# Patient Record
Sex: Female | Born: 1960 | Race: White | Hispanic: No | Smoking: Never smoker
Health system: Southern US, Community
[De-identification: ages and names within clinical notes are randomized; demographics above are authoritative.]

## PROBLEM LIST (undated history)

## (undated) DIAGNOSIS — I10 Essential (primary) hypertension: Secondary | ICD-10-CM

## (undated) HISTORY — DX: Essential (primary) hypertension: I10

---

## 1997-11-20 ENCOUNTER — Other Ambulatory Visit: Admission: RE | Admit: 1997-11-20 | Discharge: 1997-11-20 | Payer: Self-pay | Admitting: *Deleted

## 1998-03-08 ENCOUNTER — Other Ambulatory Visit: Admission: RE | Admit: 1998-03-08 | Discharge: 1998-03-08 | Payer: Self-pay | Admitting: *Deleted

## 1998-10-28 ENCOUNTER — Other Ambulatory Visit: Admission: RE | Admit: 1998-10-28 | Discharge: 1998-10-28 | Payer: Self-pay | Admitting: *Deleted

## 1999-10-27 ENCOUNTER — Other Ambulatory Visit: Admission: RE | Admit: 1999-10-27 | Discharge: 1999-10-27 | Payer: Self-pay | Admitting: *Deleted

## 2001-02-24 ENCOUNTER — Other Ambulatory Visit: Admission: RE | Admit: 2001-02-24 | Discharge: 2001-02-24 | Payer: Self-pay | Admitting: *Deleted

## 2004-04-21 ENCOUNTER — Emergency Department (HOSPITAL_COMMUNITY): Admission: EM | Admit: 2004-04-21 | Discharge: 2004-04-21 | Payer: Self-pay | Admitting: Emergency Medicine

## 2005-10-15 ENCOUNTER — Ambulatory Visit (HOSPITAL_COMMUNITY): Admission: RE | Admit: 2005-10-15 | Discharge: 2005-10-15 | Payer: Self-pay | Admitting: Obstetrics and Gynecology

## 2006-02-10 ENCOUNTER — Other Ambulatory Visit: Admission: RE | Admit: 2006-02-10 | Discharge: 2006-02-10 | Payer: Self-pay | Admitting: Obstetrics and Gynecology

## 2007-03-28 ENCOUNTER — Other Ambulatory Visit: Admission: RE | Admit: 2007-03-28 | Discharge: 2007-03-28 | Payer: Self-pay | Admitting: Obstetrics and Gynecology

## 2008-04-09 ENCOUNTER — Other Ambulatory Visit: Admission: RE | Admit: 2008-04-09 | Discharge: 2008-04-09 | Payer: Self-pay | Admitting: Obstetrics and Gynecology

## 2009-04-18 ENCOUNTER — Other Ambulatory Visit: Admission: RE | Admit: 2009-04-18 | Discharge: 2009-04-18 | Payer: Self-pay | Admitting: Obstetrics and Gynecology

## 2010-10-17 NOTE — H&P (Signed)
NAME:  Jamie Sampson, Jamie Sampson NO.:  [id number]   MEDICAL RECORD NO.:  [id number]          PATIENT TYPE:  AMB   LOCATION:                                FACILITY:  [facility]   PHYSICIAN:  Gerald Leitz, MD          DATE OF BIRTH:  10/23/1960   DATE OF ADMISSION:  10/15/2005  DATE OF DISCHARGE:                                HISTORY & PHYSICAL   HISTORY OF PRESENT ILLNESS:  This is a 50 year old, gravida 2, para 2-0-0-2,  with a history of menorrhagia. She is currently on Loestrin 24, reports  breakthrough bleeding on the Loestrin. She is a Air traffic controller for [employer] and states that when she has not been no the Loestrin, she has had  periods of extremely heavy bleeding when she would saturate a pad every hour  and soil her pants.  Her blood pressure has been elevated the past two  visits, 146/98 today, 158/98 her last visit.  She has no diagnosis of  chronic hypertension. She is afraid to go off her birth control due to the  severe menorrhagia.  Options were discussed with the patient.  She desires  endometrial ablation.   OB/GYN HISTORY:  Contraception is tubal ligation.  Spontaneous vaginal  delivery x2.  No history of abnormal Pap smears.  Last Pap smear was in July  2006.  The patient states this was normal.   PAST MEDICAL HISTORY:  None.  The patient denies a history of chronic  hypertension.  States that she has white coat syndrome.  This will need to  be further evaluated once she is off her OCPs.   PAST SURGICAL HISTORY:  Tubal ligation in 1990.   MEDICATIONS:  Loestrin 24.   ALLERGIES:  SULFA.   SOCIAL HISTORY:  The patient is a Air traffic controller in [place].  She  denies tobacco, alcohol or illicit drug use.   FAMILY HISTORY:  Paternal aunt with breast cancer. No immediate family  history of breast, ovary or colon cancer.   REVIEW OF SYSTEMS:  Negative except as stated in the history of current  illness.   PHYSICAL EXAMINATION:  VITAL SIGNS:  As stated  in the chart.  CARDIOVASCULAR:  Regular rate and rhythm.  ABDOMEN:  Soft, nontender, nondistended.  Positive bowel sounds.  LUNGS:  Clear.  EXTREMITIES:  No clubbing, cyanosis or edema.  PELVIC:  Normal external female genitalia.  No vulvar, vaginal or cervical  lesions noted.  No adnexal masses or tenderness on bimanual exam.   LABORATORY DATA:  Ultrasound performed on September 14, 2005. She has a uterus  measuring 9.3 cm in length.  AP diameter is 4.8 cm, width is 5.5 cm.  She  had 3 small uterine fibroids less than 2 cm.  Simple cyst on the left ovary,  2.3 x 2.1 cm.  Endometrial thickness was 0.37 mm.   IMPRESSION AND PLAN:  Menorrhagia with elevated blood pressure and  breakthrough bleeding on oral contraceptive pills.  Options of therapy were  discussed with the patient and she desires endometrial  ablation.  Risks,  benefits and alternatives of endometrial ablation were discussed with the  patient including but not limited to infection, bleeding, perforation of the  uterus, damage to surrounding organs, need for further surgery.  Risks of  transfusion were reviewed, although unlikely.  HIV, hepatitis B and C were  discussed with the patient.  She understands all risks and wishes to proceed  with endometrial ablation.      Gerald Leitz, MD  Electronically Signed     TC/MEDQ  D:  09/25/2005  T:  09/25/2005  Job:  [id number]

## 2010-10-17 NOTE — Op Note (Signed)
NAMEIOLA, Jamie Sampson                ACCOUNT NO.:  [id number]   MEDICAL RECORD NO.:  [id number]          PATIENT TYPE:  AMB   LOCATION:  [facility]                           [facility]ACILITY:  [facility]   PHYSICIAN:  Gerald Leitz, MD          DATE OF BIRTH:  December 02, 1960   DATE OF PROCEDURE:  10/15/2005  DATE OF DISCHARGE:                                 OPERATIVE REPORT   PREOPERATIVE DIAGNOSIS:  Menorrhagia.   POSTOPERATIVE DIAGNOSIS:  Menorrhagia.   PROCEDURE:  Endometrial ablation with ThermaChoice.   SURGEON:  Gerald Leitz, M.D.   ASSISTANT:   ANESTHESIA:  General.   SPECIMEN:  None.   ESTIMATED BLOOD LOSS:  Minimal.   FINDINGS:  The uterus sounded to 9 cm.   PROCEDURE:  The patient was taken to the operating room where she was placed  under general anesthesia. She was placed in the dorsal lithotomy position.  She was prepped and draped in the usual sterile fashion.  Bivalve speculum  was placed into the vagina.  The cervix was grasped anteriorly with a single-  tooth tenaculum.  Paracervical block was achieved with a 10 mL of 0.25%  Marcaine without epinephrine. The uterus sounded to approximately 9 cm.  The  ThermaChoice apparatus was primed to negative 150 mm Hg.  Once priming was  complete, it was placed inside the cervical os to a depth of 8 cm. Pressure  was achieved to stabilization of approximately 165 mmHg.  Ablation was  performed at 87 degrees Celsius for 8 minutes.  The apparatus was then  allowed to cool down.  It was removed from the cervix.  The single tenaculum  was removed.  There was noted to be some oozing at the tenaculum site,  silver nitrate was applied.  Excellent hemostasis was noted.  The speculum  was removed from the patient's vagina.  She was awakened from anesthesia.  She was taken to the recovery room awake and in stable condition.  All  instrument, needle, and sponge counts were correct x 2.      Gerald Leitz, MD  Electronically Signed     TC/MEDQ  D:   10/15/2005  T:  10/15/2005  Job:  [id number]

## 2011-02-16 ENCOUNTER — Other Ambulatory Visit: Payer: Self-pay | Admitting: Obstetrics and Gynecology

## 2011-02-16 ENCOUNTER — Other Ambulatory Visit (HOSPITAL_COMMUNITY)
Admission: RE | Admit: 2011-02-16 | Discharge: 2011-02-16 | Disposition: A | Discharge status: Home / Self Care | Payer: BC Managed Care – PPO | Source: Ambulatory Visit | Attending: Obstetrics and Gynecology | Admitting: Obstetrics and Gynecology

## 2011-02-16 DIAGNOSIS — Z01419 Encounter for gynecological examination (general) (routine) without abnormal findings: Secondary | ICD-10-CM | POA: Insufficient documentation

## 2011-04-06 ENCOUNTER — Other Ambulatory Visit: Payer: Self-pay | Admitting: Family Medicine

## 2011-04-06 ENCOUNTER — Ambulatory Visit
Admission: RE | Admit: 2011-04-06 | Discharge: 2011-04-06 | Disposition: A | Discharge status: Home / Self Care | Payer: BC Managed Care – PPO | Source: Ambulatory Visit | Attending: Family Medicine | Admitting: Family Medicine

## 2011-04-06 DIAGNOSIS — T1490XA Injury, unspecified, initial encounter: Secondary | ICD-10-CM

## 2011-04-06 IMAGING — CT CT FOOT*L* W/O CM
1 series · 12 of 14 positions shown, 15 images · non-contrast
Comparison: None

CLINICAL DATA: Fell.  Evaluate foot fracture.

CT OF THE LEFT FOOT WITHOUT CONTRAST
TECHNIQUE: Multidetector CT imaging was performed according to the
standard protocol. Multiplanar CT image reconstructions were also
generated.

[Series 401: batch 1 · axial · 0.55mm/px · z∈[-155,-136]mm · 12 of 37 slices shown, 15 images]
[im 3/37  soft-tissue]
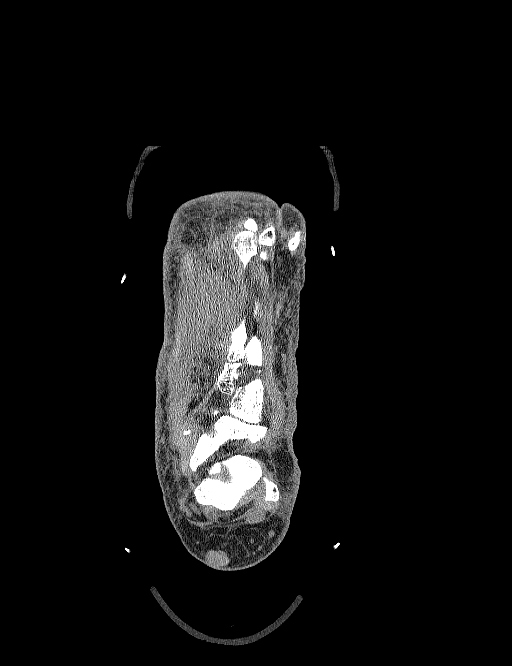
[im 3/37  bone]
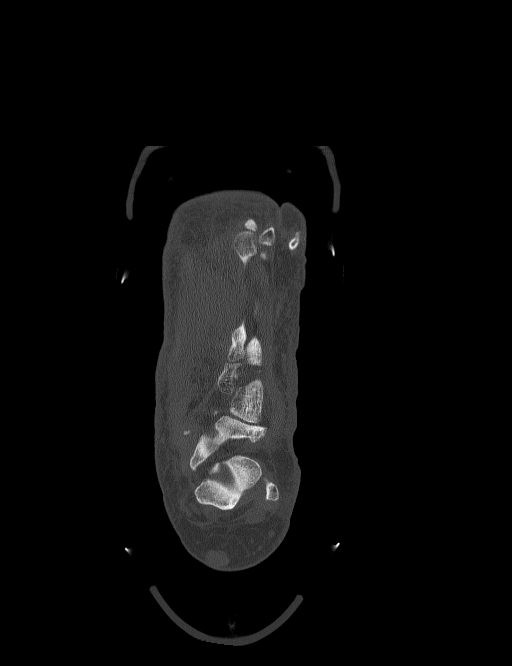
[im 6/37  bone]
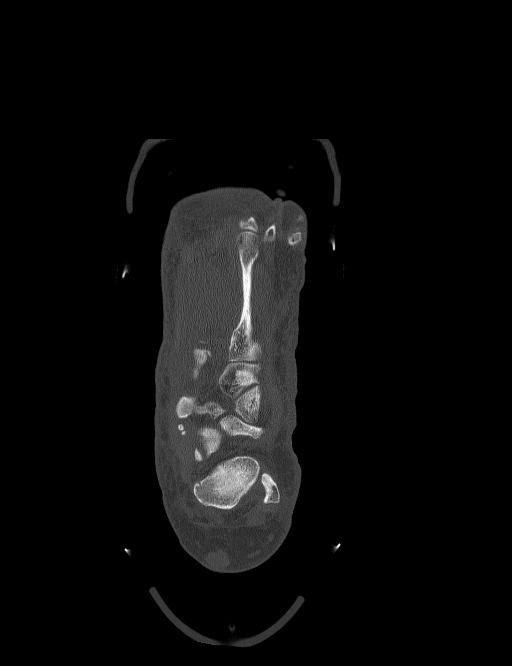
[im 9/37  bone]
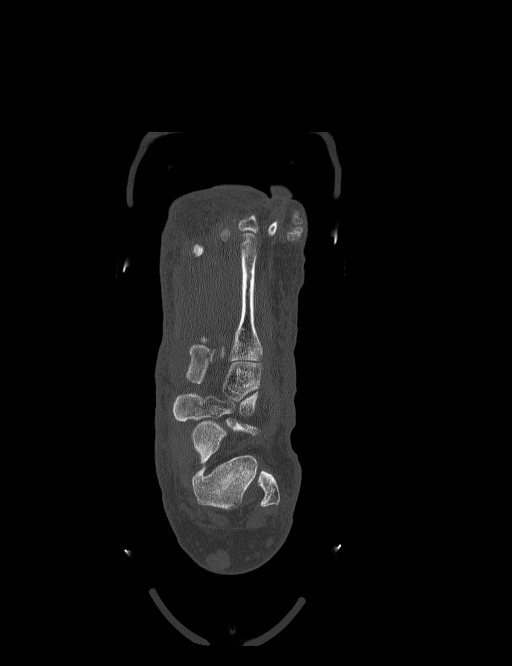
[im 12/37  bone]
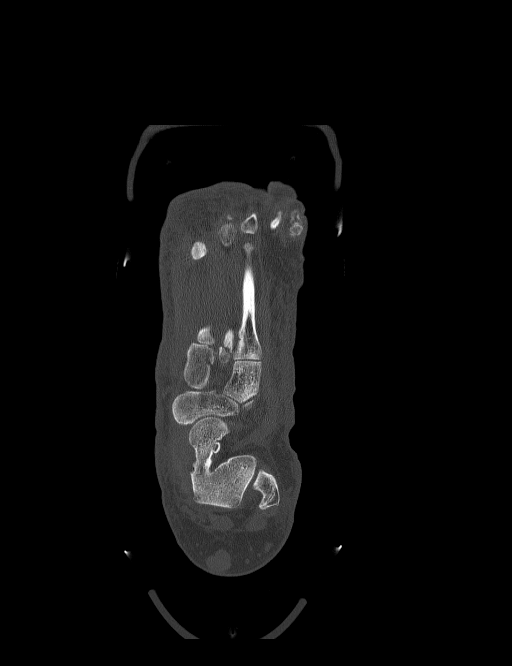
[im 14/37  soft-tissue]
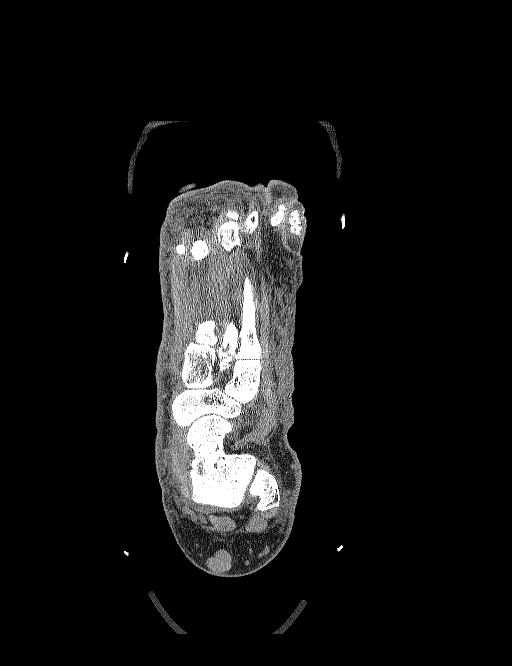
[im 14/37  bone]
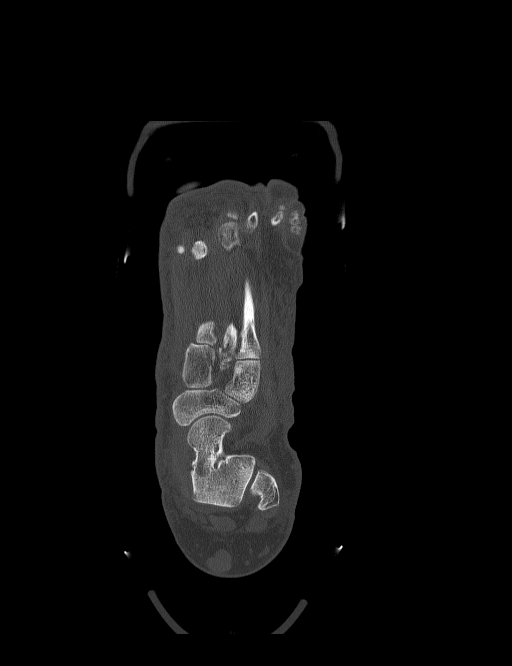
[im 17/37  bone]
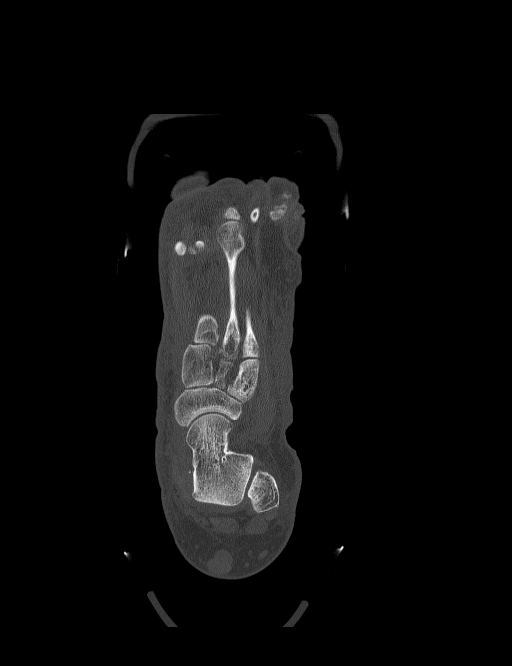
[im 20/37  bone]
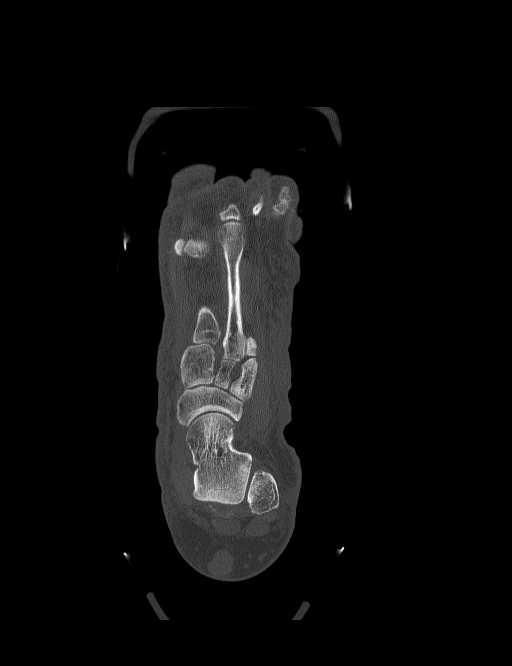
[im 23/37  bone]
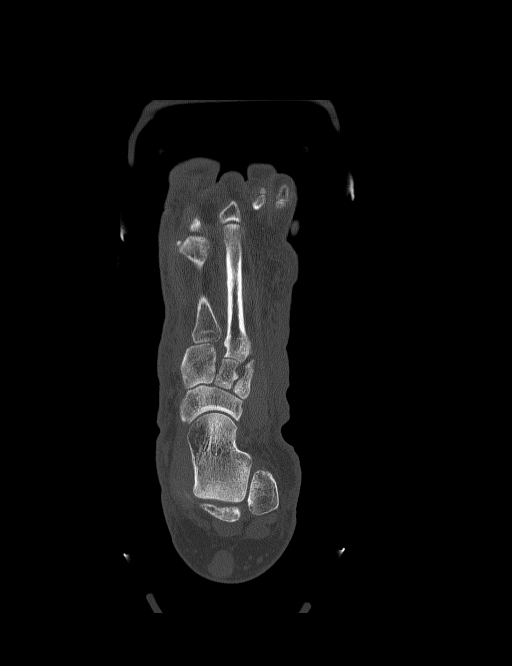
[im 25/37  soft-tissue]
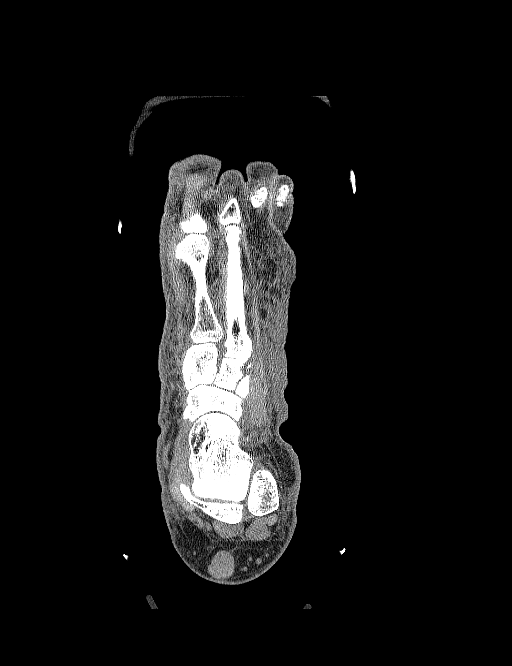
[im 25/37  bone]
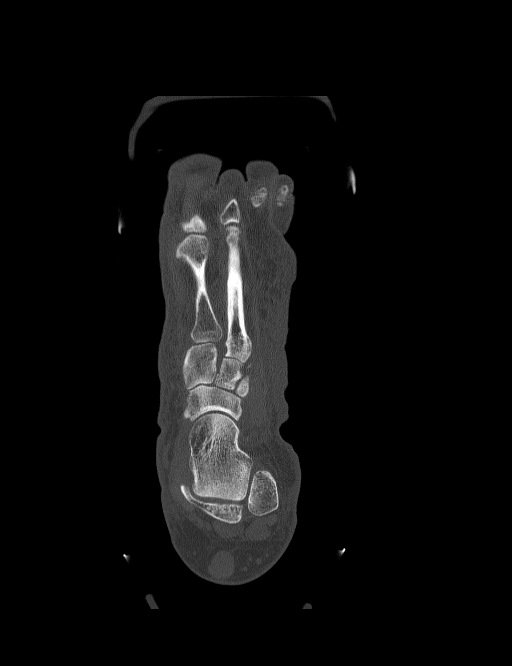
[im 28/37  bone]
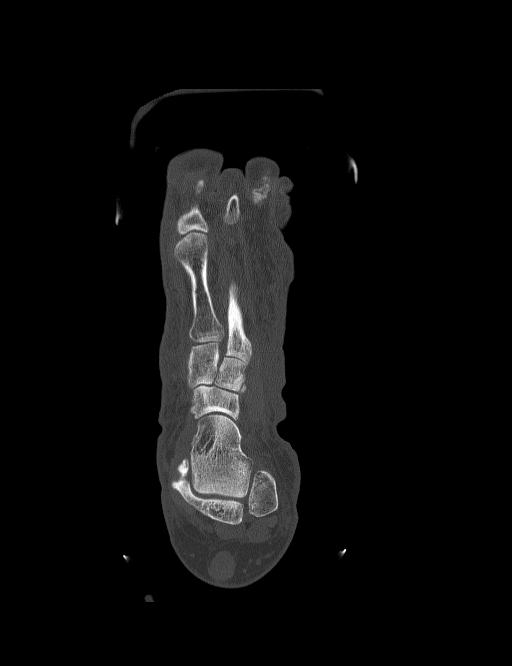
[im 31/37  bone]
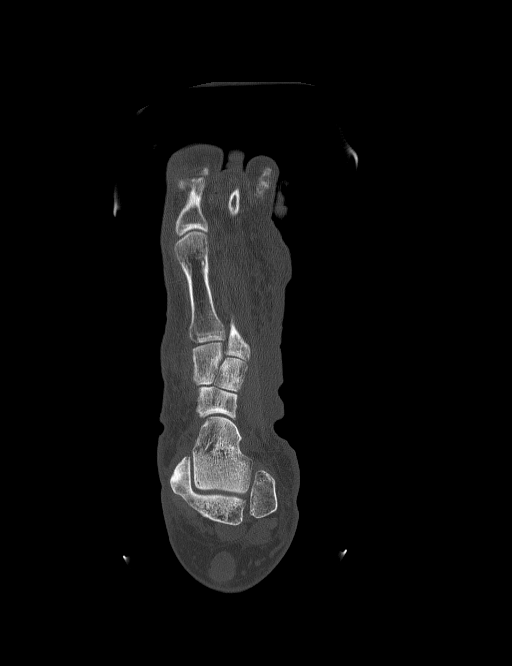
[im 34/37  bone]
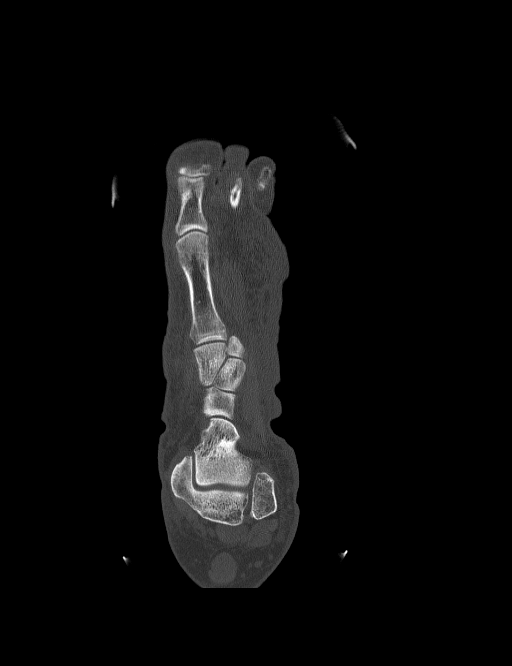

[12 of 14 positions shown; findings below may reference images not displayed]

FINDINGS: There is a fracture through the base of the second
metatarsal along with tiny avulsion fracture involving the base of
the first metatarsal laterally and also a fracture of the lateral
aspect of the medial cuneiform.  There is very slight lateral
subluxation of the first metatarsal in relation to the medial
cuneiform.  Findings consistent with a Lisfranc injury.  The [DATE] and fifth metatarsals are intact.  No fracture of the middle
or lateral cuneiforms.  Cuboid is intact.  The tibiotalar joint is
maintained.  No ankle fracture or osteochondral lesion.  The
subtalar joints are maintained.  The talonavicular joint is normal.
IMPRESSION: 1.  Fractures involving the base of the first and second
metatarsals and a fracture involving the lateral aspect of the
medial cuneiform.
2.  Mild lateral subluxation of the first metatarsal in relation to
the medial cuneiform consistent with a Lisfranc's injury.

## 2012-04-04 ENCOUNTER — Ambulatory Visit (INDEPENDENT_AMBULATORY_CARE_PROVIDER_SITE_OTHER): Payer: BC Managed Care – PPO | Admitting: Physician Assistant

## 2012-04-04 ENCOUNTER — Encounter: Payer: Self-pay | Admitting: Physician Assistant

## 2012-04-04 VITALS — BP 130/86 | HR 98 | Temp 98.5°F | Resp 20 | Ht 64.0 in | Wt 221.8 lb

## 2012-04-04 DIAGNOSIS — G4700 Insomnia, unspecified: Secondary | ICD-10-CM

## 2012-04-04 DIAGNOSIS — I10 Essential (primary) hypertension: Secondary | ICD-10-CM

## 2012-04-04 DIAGNOSIS — R5381 Other malaise: Secondary | ICD-10-CM

## 2012-04-04 LAB — BASIC METABOLIC PANEL
CO2: 25 mEq/L (ref 19–32)
Calcium: 9.8 mg/dL (ref 8.4–10.5)
Chloride: 103 mEq/L (ref 96–112)
Creat: 0.74 mg/dL (ref 0.50–1.10)
Potassium: 4 mEq/L (ref 3.5–5.3)
Sodium: 142 mEq/L (ref 135–145)

## 2012-04-04 LAB — TSH: TSH: 3.118 u[IU]/mL (ref 0.350–4.500)

## 2012-04-04 MED ORDER — LISINOPRIL 5 MG PO TABS: 5.0000 mg | ORAL_TABLET | Freq: Every day | ORAL | Status: DC

## 2012-04-04 NOTE — Progress Notes (Signed)
Patient ID: Jamie Sampson MRN: [id number], DOB: 28-Jul-1960, 51 y.o. Date of Encounter: 04/04/2012, 4:23 PM  Primary Physician: No primary provider on file.  Chief Complaint: HTN  HPI: 51 y.o. year old female with history below presents for hypertension follow up. Doing well. Tolerating current dose of Lisinopril 5 mg daily. Not taking Chlorthalidone. No chest pain, headache, vision changes, or focal deficits. Not exercising or eating healthy. Has continued to gain weight. Would like to lose 50 pounds. States that she usually goes to bed around 10 or 10:30 and can sleep until around midnight, then she wakes up and cannot get back into a good nights sleep for the rest of the night. She is then too tired the following day to get and exercise done, or to cook and healthy meal. She is uncertain is she snores. She does however fall asleep very easily while sitting on the sofa.   She did suffer a Lis-Frank fracture of the left foot almost one year ago, still a little sore, but doing ok.    Past Medical History  Diagnosis Date  . Hypertension      Home Meds: Prior to Admission medications   Medication Sig Start Date End Date Taking? Authorizing Provider  lisinopril (PRINIVIL,ZESTRIL) 5 MG tablet Take 1 tablet (5 mg total) by mouth daily. 04/04/12  Yes Jadin Kagel M Kathrynne Kulinski, PA-C  Multiple Vitamin (MULTIVITAMIN) tablet Take 1 tablet by mouth daily.   Yes Historical Provider, MD    Allergies:  Allergies  Allergen Reactions  . Sulfa Antibiotics Hives    History   Social History  . Marital Status: Divorced    Spouse Name: N/A    Number of Children: N/A  . Years of Education: N/A   Occupational History  . Not on file.   Social History Main Topics  . Smoking status: Never Smoker   . Smokeless tobacco: Not on file  . Alcohol Use: Not on file  . Drug Use: Not on file  . Sexually Active: Not on file   Other Topics Concern  . Not on file   Social History Narrative  . No narrative on file     Family History  Problem Relation Age of Onset  . Hypertension Mother   . Neuropathy Mother   . Cancer Father   . Cancer Paternal Aunt   . Diabetes Maternal Grandmother   . COPD Maternal Grandfather   . Alcohol abuse Maternal Aunt     Review of Systems: Constitutional: negative for chills, fever, or night sweats  HEENT: negative for vision changes, hearing loss, congestion, rhinorrhea, ST, epistaxis, or sinus pressure Cardiovascular: negative for chest pain, palpitations, or DOE Respiratory: negative for hemoptysis, wheezing, shortness of breath, or cough Abdominal: negative for abdominal pain, nausea, vomiting, diarrhea, or constipation Dermatological: negative for rash Neurologic: negative for headache, dizziness, or syncope   Physical Exam: Blood pressure 130/86, pulse 98, temperature 98.5 F (36.9 C), temperature source Oral, resp. rate 20, height 5\' 4"  (1.626 m), weight 221 lb 12.8 oz (100.608 kg), SpO2 98.00%., Body mass index is 38.07 kg/(m^2). General: Well developed, well nourished, in no acute distress. Head: Normocephalic, atraumatic, eyes without discharge, sclera non-icteric, nares are without discharge. Bilateral auditory canals clear, TM's are without perforation, pearly grey and translucent with reflective cone of light bilaterally. Oral cavity moist, posterior pharynx without exudate, erythema, peritonsillar abscess, or post nasal drip.  Neck: Supple. No thyromegaly. Full ROM. No lymphadenopathy. No carotid bruits. Lungs: Clear bilaterally to auscultation without wheezes,  rales, or rhonchi. Breathing is unlabored. Heart: RRR with S1 S2. No murmurs, rubs, or gallops appreciated.  Msk:  Strength and tone normal for age. Extremities/Skin: Warm and dry. No clubbing or cyanosis. No edema. No rashes or suspicious lesions. Distal pulses 2+ and equal bilaterally. Neuro: Alert and oriented X 3. Moves all extremities spontaneously. Gait is normal. CNII-XII grossly in tact.  DTR 2+, cerebellar function intact. Rhomberg normal. Psych:  Responds to questions appropriately with a normal affect.   Labs:  BMP and TSH pending  ASSESSMENT AND PLAN:  51 y.o. year old female with hypertension, insomnia, obesity, and fatigue. 1. Hypertension -Continue Lisinopril 5 mg 1 po daily #90 RF 3 -Healthy diet and exercise -Must lose weight  2. Insomnia/fatigue -Likely sleep apnea component to her fatigue during the day and feelings of not being able to get good sleep -If she were able to get good sleep she would not be as tired during the day and would be able to exercise, she would also feel more energetic about cooking and not just grab something quick and easy because she is so tired.  -Will also check a TSH -Referral for sleep study  3. Obesity -Likely playing a role in the above possible sleep apnea -Must lose weight -Must eat healthy -She agrees to both of these terms -Will recheck patient pending the sleep study   Signed, Eula Listen, PA-C 04/04/2012 4:23 PM

## 2012-11-03 ENCOUNTER — Other Ambulatory Visit: Payer: Self-pay | Admitting: Obstetrics and Gynecology

## 2012-11-03 ENCOUNTER — Other Ambulatory Visit (HOSPITAL_COMMUNITY)
Admission: RE | Admit: 2012-11-03 | Discharge: 2012-11-03 | Disposition: A | Discharge status: Home / Self Care | Payer: BC Managed Care – PPO | Source: Ambulatory Visit | Attending: Obstetrics and Gynecology | Admitting: Obstetrics and Gynecology

## 2012-11-03 DIAGNOSIS — Z1151 Encounter for screening for human papillomavirus (HPV): Secondary | ICD-10-CM | POA: Insufficient documentation

## 2012-11-03 DIAGNOSIS — Z01419 Encounter for gynecological examination (general) (routine) without abnormal findings: Secondary | ICD-10-CM | POA: Insufficient documentation

## 2013-02-08 ENCOUNTER — Encounter: Payer: Self-pay | Admitting: Family Medicine

## 2014-03-26 ENCOUNTER — Other Ambulatory Visit: Payer: Self-pay | Admitting: Obstetrics and Gynecology

## 2014-03-26 ENCOUNTER — Other Ambulatory Visit (HOSPITAL_COMMUNITY)
Admission: RE | Admit: 2014-03-26 | Discharge: 2014-03-26 | Disposition: A | Discharge status: Home / Self Care | Payer: BC Managed Care – PPO | Source: Ambulatory Visit | Attending: Obstetrics and Gynecology | Admitting: Obstetrics and Gynecology

## 2014-03-26 DIAGNOSIS — Z01419 Encounter for gynecological examination (general) (routine) without abnormal findings: Secondary | ICD-10-CM | POA: Insufficient documentation

## 2014-03-27 LAB — CYTOLOGY - PAP

## 2015-04-15 ENCOUNTER — Telehealth: Payer: Self-pay | Admitting: Obstetrics and Gynecology

## 2015-10-14 ENCOUNTER — Other Ambulatory Visit: Payer: Self-pay | Admitting: Obstetrics and Gynecology

## 2015-10-14 ENCOUNTER — Other Ambulatory Visit (HOSPITAL_COMMUNITY)
Admission: RE | Admit: 2015-10-14 | Discharge: 2015-10-14 | Disposition: A | Discharge status: Home / Self Care | Payer: BC Managed Care – PPO | Source: Ambulatory Visit | Attending: Obstetrics and Gynecology | Admitting: Obstetrics and Gynecology

## 2015-10-14 DIAGNOSIS — Z1151 Encounter for screening for human papillomavirus (HPV): Secondary | ICD-10-CM | POA: Diagnosis present

## 2015-10-14 DIAGNOSIS — Z01419 Encounter for gynecological examination (general) (routine) without abnormal findings: Secondary | ICD-10-CM | POA: Diagnosis present

## 2015-10-15 LAB — CYTOLOGY - PAP

## 2016-05-18 NOTE — Telephone Encounter (Signed)
error 

## 2017-10-28 ENCOUNTER — Ambulatory Visit: Payer: BC Managed Care – PPO | Admitting: Allergy and Immunology

## 2017-11-15 ENCOUNTER — Encounter: Payer: Self-pay | Admitting: Podiatry

## 2017-11-15 ENCOUNTER — Ambulatory Visit: Payer: BC Managed Care – PPO | Admitting: Podiatry

## 2017-11-15 ENCOUNTER — Ambulatory Visit (INDEPENDENT_AMBULATORY_CARE_PROVIDER_SITE_OTHER): Payer: BC Managed Care – PPO

## 2017-11-15 VITALS — BP 142/66 | HR 77 | Temp 98.2°F | Resp 16 | Ht 65.0 in | Wt 215.0 lb

## 2017-11-15 DIAGNOSIS — M722 Plantar fascial fibromatosis: Secondary | ICD-10-CM | POA: Diagnosis not present

## 2017-11-15 NOTE — Patient Instructions (Signed)

## 2017-11-15 NOTE — Progress Notes (Signed)
   Subjective:    Patient ID: Jamie Sampson, female    DOB: May 20, 1961, 57 y.o.   MRN: [id number]  HPI    Review of Systems  Musculoskeletal: Positive for arthralgias, gait problem, joint swelling and myalgias.  All other systems reviewed and are negative.      Objective:   Physical Exam        Assessment & Plan:

## 2017-11-30 ENCOUNTER — Telehealth: Payer: Self-pay | Admitting: Podiatry

## 2017-11-30 NOTE — Telephone Encounter (Signed)
I was calling to see if there is anyway I can get a copy of my x-rays to a CD. You can reach me at [phone]. Thank you. Bye bye.

## 2017-11-30 NOTE — Telephone Encounter (Signed)
Called and spoke with patient she will come by and fill out the release and pick up the disc on Wednesday 11/30/17

## 2017-12-01 DIAGNOSIS — M79676 Pain in unspecified toe(s): Secondary | ICD-10-CM

## 2017-12-06 ENCOUNTER — Ambulatory Visit: Payer: BC Managed Care – PPO | Admitting: Podiatry

## 2017-12-29 NOTE — Progress Notes (Signed)
  Subjective:  Patient ID: Jamie Sampson, female    DOB: March 13, 1961,  MRN: [id number]  Chief Complaint  Patient presents with  . Foot Pain    R bottom heel x 3 mo; 3-10/10 sharp constant pain -worst at night Tx: ibuprofne    57 y.o. female presents with the above complaint.  Reports pain to the bottom of the right heel for 3 months.  Reports 3-10 sharp constant pain worse at night.  Review of Systems: Negative except as noted in the HPI. Denies N/V/F/Ch.  Past Medical History:  Diagnosis Date  . Hypertension     Current Outpatient Medications:  .  lisinopril (PRINIVIL,ZESTRIL) 10 MG tablet, Take 10 mg by mouth daily., Disp: , Rfl:  .  lovastatin (MEVACOR) 40 MG tablet, Take 40 mg by mouth at bedtime., Disp: , Rfl:  .  Multiple Vitamin (MULTIVITAMIN) tablet, Take 1 tablet by mouth daily., Disp: , Rfl:   Social History   Tobacco Use  Smoking Status Never Smoker  Smokeless Tobacco Never Used    Allergies  Allergen Reactions  . Sulfa Antibiotics Hives   Objective:   Vitals:   11/15/17 1148  BP: (!) 142/66  Pulse: 77  Resp: 16  Temp: 98.2 F (36.8 C)   Body mass index is 35.78 kg/m. Constitutional Well developed. Well nourished.  Vascular Dorsalis pedis pulses palpable bilaterally. Posterior tibial pulses palpable bilaterally. Capillary refill normal to all digits.  No cyanosis or clubbing noted. Pedal hair growth normal.  Neurologic Normal speech. Oriented to person, place, and time. Epicritic sensation to light touch grossly present bilaterally.  Dermatologic Nails well groomed and normal in appearance. No open wounds. No skin lesions.  Orthopedic: Normal joint ROM without pain or crepitus bilaterally. No visible deformities. Tender to palpation at the calcaneal tuber right. No pain with calcaneal squeeze right. Ankle ROM diminished range of motion right. Silfverskiold Test: positive right.   Radiographs: Taken and reviewed. No acute fractures or  dislocations. No evidence of stress fracture.   Assessment:   1. Plantar fasciitis of right foot    Plan:  Patient was evaluated and treated and all questions answered.  Plantar Fasciitis, right - XR reviewed as above.  - Educated on icing and stretching. Instructions given.  - Injection delivered to the plantar fascia as below. - Pharmacologic management: Meloxicam. Educated on risks/benefits and proper taking of medication.  Procedure: Injection Tendon/Ligament Location: Right plantar fascia at the glabrous junction; medial approach. Skin Prep: alcohol Injectate: 1 cc 0.5% marcaine plain, 1 cc dexamethasone phosphate, 0.5 cc kenalog 10. Disposition: Patient tolerated procedure well. Injection site dressed with a band-aid.  Return in about 3 weeks (around 12/06/2017) for Plantar fasciitis.

## 2018-06-06 ENCOUNTER — Other Ambulatory Visit (HOSPITAL_COMMUNITY)
Admission: RE | Admit: 2018-06-06 | Discharge: 2018-06-06 | Disposition: A | Discharge status: Home / Self Care | Payer: BC Managed Care – PPO | Source: Ambulatory Visit | Attending: Obstetrics and Gynecology | Admitting: Obstetrics and Gynecology

## 2018-06-06 ENCOUNTER — Other Ambulatory Visit: Payer: Self-pay | Admitting: Obstetrics and Gynecology

## 2018-06-06 DIAGNOSIS — Z01419 Encounter for gynecological examination (general) (routine) without abnormal findings: Secondary | ICD-10-CM | POA: Insufficient documentation

## 2018-06-07 LAB — CYTOLOGY - PAP
Diagnosis: NEGATIVE
HPV: NOT DETECTED

## 2021-09-22 ENCOUNTER — Other Ambulatory Visit (HOSPITAL_COMMUNITY)
Admission: RE | Admit: 2021-09-22 | Discharge: 2021-09-22 | Disposition: A | Discharge status: Home / Self Care | Payer: BC Managed Care – PPO | Source: Ambulatory Visit | Attending: Obstetrics and Gynecology | Admitting: Obstetrics and Gynecology

## 2021-09-22 ENCOUNTER — Other Ambulatory Visit: Payer: Self-pay | Admitting: Obstetrics and Gynecology

## 2021-09-22 DIAGNOSIS — Z01419 Encounter for gynecological examination (general) (routine) without abnormal findings: Secondary | ICD-10-CM | POA: Diagnosis not present

## 2021-09-24 LAB — CYTOLOGY - PAP
Comment: NEGATIVE
Diagnosis: NEGATIVE
High risk HPV: NEGATIVE

## 2023-04-28 ENCOUNTER — Encounter (HOSPITAL_BASED_OUTPATIENT_CLINIC_OR_DEPARTMENT_OTHER): Payer: Self-pay

## 2023-04-28 ENCOUNTER — Other Ambulatory Visit (HOSPITAL_BASED_OUTPATIENT_CLINIC_OR_DEPARTMENT_OTHER): Payer: Self-pay

## 2023-04-28 ENCOUNTER — Ambulatory Visit (HOSPITAL_BASED_OUTPATIENT_CLINIC_OR_DEPARTMENT_OTHER)
Admission: EM | Admit: 2023-04-28 | Discharge: 2023-04-28 | Disposition: A | Discharge status: Home / Self Care | Payer: BC Managed Care – PPO

## 2023-04-28 DIAGNOSIS — B350 Tinea barbae and tinea capitis: Secondary | ICD-10-CM | POA: Diagnosis not present

## 2023-04-28 DIAGNOSIS — I10 Essential (primary) hypertension: Secondary | ICD-10-CM

## 2023-04-28 MED ORDER — KETOCONAZOLE 1 % EX SHAM
MEDICATED_SHAMPOO | CUTANEOUS | 0 refills | Status: DC
  Filled 2023-04-28: qty 200, fill #0

## 2023-04-28 MED ORDER — KETOCONAZOLE 2 % EX SHAM
1.0000 | MEDICATED_SHAMPOO | CUTANEOUS | 0 refills | Status: AC
  Filled 2023-04-28: qty 120, 14d supply, fill #0

## 2023-04-28 NOTE — ED Triage Notes (Signed)
Rash to scalp onset Monday. States used topical medications with no improvement. Patient reports recently purchased new memory foam pillows. No drainage. States burning no itching.Patient's blood pressure is elevated. States pcp is changing blood pressure meds.

## 2023-04-28 NOTE — ED Provider Notes (Signed)
[facility]    CSN: [id number] Arrival date & time: 04/28/23  8299      History   Chief Complaint Chief Complaint  Patient presents with   Rash    HPI Jamie Sampson is a 62 y.o. female.   Jamie Sampson is a 62 y.o. female presenting for chief complaint of rash to the right side of the head that started 2 days ago on Monday April 26, 2023. Rash is patchy, red, raised, and slightly itchy per patient. Denies warmth, pain, and drainage to rash. Recently changed pillows to a new memory foam pillow, uses a pillow case. States she sleeps on the right side for most of the night and often gets rather warm in her sleep. Recently changed laundry detergent and was started on Cymbalta a few days ago, otherwise no recent changes in personal hygiene products, medications, shampoos, lotions, perfumes, makeup, etc. Denies history of immunosuppression/diabetes. Used triamcinolone cream to the area once, states this made the rash worse. Also attempted use of leftover flagyl cream, this did not help either. No recent fevers, chills, aches.  BP noted to be elevated at 186/111. History of HTN. Reports recent medication changes by PCP and history of "white coat syndrome". Denies headaches, CP, SOB, palpitations, dizziness, vision changes.     Past Medical History:  Diagnosis Date   Hypertension     There are no problems to display for this patient.   History reviewed. No pertinent surgical history.  OB History   No obstetric history on file.      Home Medications    Prior to Admission medications   Medication Sig Start Date End Date Taking? Authorizing Provider  DULoxetine (CYMBALTA) 30 MG capsule Take 30 mg by mouth daily.   Yes [provider]  ketoconazole (NIZORAL) 2 % shampoo Apply 1 Application topically every 3 (three) days for 14 days. Lather shampoo onto affected scalp, allow to sit for 5-10 minutes without washing off, then rinse thoroughly. Pat dry  after showering. 04/28/23 05/12/23 Yes Carlisle Beers, FNP  rosuvastatin (CRESTOR) 10 MG tablet Take 10 mg by mouth daily.   Yes [provider]  lisinopril (PRINIVIL,ZESTRIL) 10 MG tablet Take 10 mg by mouth daily.    [provider]  lovastatin (MEVACOR) 40 MG tablet Take 40 mg by mouth at bedtime.    [provider]  Multiple Vitamin (MULTIVITAMIN) tablet Take 1 tablet by mouth daily.    [provider]    Family History Family History  Problem Relation Age of Onset   Hypertension Mother    Neuropathy Mother    Cancer Father    Cancer Paternal Aunt    Diabetes Maternal Grandmother    COPD Maternal Grandfather    Alcohol abuse Maternal Aunt     Social History Social History   Tobacco Use   Smoking status: Never   Smokeless tobacco: Never     Allergies   Sulfa antibiotics   Review of Systems Review of Systems Per HPI  Physical Exam Triage Vital Signs ED Triage Vitals  Encounter Vitals Group     BP 04/28/23 0839 (!) 186/111     Systolic BP Percentile --      Diastolic BP Percentile --      Pulse Rate 04/28/23 0839 87     Resp --      Temp 04/28/23 0839 98.4 F (36.9 C)     Temp Source 04/28/23 0839 Oral     SpO2  04/28/23 0839 98 %     Weight --      Height --      Head Circumference --      Peak Flow --      Pain Score 04/28/23 0842 0     Pain Loc --      Pain Education --      Exclude from Growth Chart --    No data found.  Updated Vital Signs BP (!) 186/111 (BP Location: Right Arm)   Pulse 87   Temp 98.4 F (36.9 C) (Oral)   SpO2 98%   Visual Acuity Right Eye Distance:   Left Eye Distance:   Bilateral Distance:    Right Eye Near:   Left Eye Near:    Bilateral Near:     Physical Exam Vitals and nursing note reviewed.  Constitutional:      Appearance: She is not ill-appearing or toxic-appearing.  HENT:     Head: Normocephalic and atraumatic.     Right Ear: Hearing and external ear normal.      Left Ear: Hearing and external ear normal.     Nose: Nose normal.     Mouth/Throat:     Lips: Pink.  Eyes:     General: Lids are normal. Vision grossly intact. Gaze aligned appropriately.     Extraocular Movements: Extraocular movements intact.     Conjunctiva/sclera: Conjunctivae normal.  Cardiovascular:     Rate and Rhythm: Normal rate and regular rhythm.     Heart sounds: Normal heart sounds, S1 normal and S2 normal.  Pulmonary:     Effort: Pulmonary effort is normal. No respiratory distress.     Breath sounds: Normal breath sounds and air entry.  Musculoskeletal:     Cervical back: Neck supple.  Lymphadenopathy:     Cervical: No cervical adenopathy.  Skin:    General: Skin is warm and dry.     Capillary Refill: Capillary refill takes less than 2 seconds.     Findings: Rash present.     Comments: Patchy, raised, hyperpigmented, rash with dispersed papular lesions to the right scalp. No warmth or tenderness to palpation. See image below. Rash is localized to the right temporal/parietal scalp.  Neurological:     General: No focal deficit present.     Mental Status: She is alert and oriented to person, place, and time. Mental status is at baseline.     Cranial Nerves: No dysarthria or facial asymmetry.  Psychiatric:        Mood and Affect: Mood normal.        Speech: Speech normal.        Behavior: Behavior normal.        Thought Content: Thought content normal.        Judgment: Judgment normal.    Right scalp   UC Treatments / Results  Labs (all labs ordered are listed, but only abnormal results are displayed) Labs Reviewed - No data to display  EKG   Radiology No results found.  Procedures Procedures (including critical care time)  Medications Ordered in UC Medications - No data to display  Initial Impression / Assessment and Plan / UC Course  I have reviewed the triage vital signs and the nursing notes.  Pertinent labs & imaging results that were  available during my care of the patient were reviewed by me and considered in my medical decision making (see chart for details).   1. Tinea capitis Unclear etiology of rash, however likely  fungal. Low suspicion for bacterial/inflammatory etiology. Ketoconazole shampoo as directed every 3 days for the next 2 weeks. PCP follow-up in 5-7 days, sooner at urgent care if worsening symptoms. Discussed signs/symptoms of secondary bacterial infection/return precautions.   2. Elevated blood pressure reading in office with diagnosis of hypertension BP elevated today, no red flag signs/symptoms indicating need for referral to ED secondary to elevated BP. Advised PCP follow-up and continue checking BP at home. BP journal encouraged.  Counseled patient on potential for adverse effects with medications prescribed/recommended today, strict ER and return-to-clinic precautions discussed, patient verbalized understanding.    Final Clinical Impressions(s) / UC Diagnoses   Final diagnoses:  Tinea capitis  Essential hypertension     Discharge Instructions      Ketoconazole shampoo every 3 days for the next 2 weeks as instructed on bottle.  Use cotton pillow case.   Follow-up with PCP in 5-7 days, return to urgent care if worsening symptoms such as warmth, swelling, itching, fevers, chills, etc.    ED Prescriptions     Medication Sig Dispense Auth. Provider   KETOCONAZOLE, TOPICAL, 1 % SHAM  (Status: Discontinued) Lather shampoo onto affected scalp, allow to sit for 5-10 minutes without washing off, then rinse thoroughly. Pat dry after showering. 200 mL Reita May M, FNP   ketoconazole (NIZORAL) 2 % shampoo Apply 1 Application topically every 3 (three) days for 14 days. Lather shampoo onto affected scalp, allow to sit for 5-10 minutes without washing off, then rinse thoroughly. Pat dry after showering. 120 mL Carlisle Beers, FNP      PDMP not reviewed this encounter.   Carlisle Beers, [place] 04/28/23 [id number]

## 2023-04-28 NOTE — Discharge Instructions (Signed)
Ketoconazole shampoo every 3 days for the next 2 weeks as instructed on bottle.  Use cotton pillow case.   Follow-up with PCP in 5-7 days, return to urgent care if worsening symptoms such as warmth, swelling, itching, fevers, chills, etc.

## 2023-10-20 ENCOUNTER — Ambulatory Visit (HOSPITAL_BASED_OUTPATIENT_CLINIC_OR_DEPARTMENT_OTHER): Admission: EM | Admit: 2023-10-20 | Discharge: 2023-10-20 | Disposition: A | Discharge status: Home / Self Care

## 2023-10-20 ENCOUNTER — Ambulatory Visit (HOSPITAL_BASED_OUTPATIENT_CLINIC_OR_DEPARTMENT_OTHER): Admitting: Radiology

## 2023-10-20 ENCOUNTER — Encounter (HOSPITAL_BASED_OUTPATIENT_CLINIC_OR_DEPARTMENT_OTHER): Payer: Self-pay

## 2023-10-20 DIAGNOSIS — M7989 Other specified soft tissue disorders: Secondary | ICD-10-CM

## 2023-10-20 DIAGNOSIS — Z23 Encounter for immunization: Secondary | ICD-10-CM

## 2023-10-20 DIAGNOSIS — M79642 Pain in left hand: Secondary | ICD-10-CM | POA: Diagnosis not present

## 2023-10-20 DIAGNOSIS — S61215A Laceration without foreign body of left ring finger without damage to nail, initial encounter: Secondary | ICD-10-CM | POA: Diagnosis not present

## 2023-10-20 DIAGNOSIS — M79641 Pain in right hand: Secondary | ICD-10-CM

## 2023-10-20 MED ORDER — MUPIROCIN 2 % EX OINT: 1.0000 | TOPICAL_OINTMENT | Freq: Two times a day (BID) | CUTANEOUS | 0 refills | Status: DC

## 2023-10-20 MED ORDER — AMOXICILLIN-POT CLAVULANATE 875-125 MG PO TABS: 1.0000 | ORAL_TABLET | Freq: Two times a day (BID) | ORAL | 0 refills | Status: AC

## 2023-10-20 MED ORDER — TETANUS-DIPHTH-ACELL PERTUSSIS 5-2.5-18.5 LF-MCG/0.5 IM SUSY
0.5000 mL | PREFILLED_SYRINGE | Freq: Once | INTRAMUSCULAR | Status: AC
  Administered 2023-10-20: 0.5 mL via INTRAMUSCULAR

## 2023-10-20 NOTE — ED Provider Notes (Signed)
 Jamie Sampson CARE    CSN: [id number] Arrival date & time: 10/20/23  1844      History   Chief Complaint Chief Complaint  Patient presents with   Animal Bite    HPI Jamie Sampson is a 63 y.o. female.   Patient was trying to break up a fight between her dog and 2 other dogs.  Her dog is a pit bull the other dogs were Rottweiler mix.  She has bruising and swelling of both hands and a bite to the palmar surface of the fourth finger of the left hand.  She has good range of motion but her hands hurt.   Animal Bite Associated symptoms: no fever and no rash     Past Medical History:  Diagnosis Date   Hypertension     There are no active problems to display for this patient.   History reviewed. No pertinent surgical history.  OB History   No obstetric history on file.      Home Medications    Prior to Admission medications   Medication Sig Start Date End Date Taking? Authorizing Provider  amoxicillin-clavulanate (AUGMENTIN) 875-125 MG tablet Take 1 tablet by mouth 2 (two) times daily after a meal for 7 days. 10/20/23 10/27/23 Yes Guss Legacy, FNP  hydrochlorothiazide (MICROZIDE) 12.5 MG capsule Take 12.5 mg by mouth daily. 09/27/23  Yes [provider]  losartan (COZAAR) 50 MG tablet Take 50 mg by mouth daily. 08/16/23  Yes [provider]  mupirocin ointment (BACTROBAN) 2 % Apply 1 Application topically 2 (two) times daily. 10/20/23  Yes Guss Legacy, FNP  Multiple Vitamin (MULTIVITAMIN) tablet Take 1 tablet by mouth daily.    [provider]  rosuvastatin (CRESTOR) 10 MG tablet Take 10 mg by mouth daily.    [provider]    Family History Family History  Problem Relation Age of Onset   Hypertension Mother    Neuropathy Mother    Cancer Father    Cancer Paternal Aunt    Diabetes Maternal Grandmother    COPD Maternal Grandfather    Alcohol abuse Maternal Aunt     Social History Social History   Tobacco Use   Smoking  status: Never   Smokeless tobacco: Never     Allergies   Sulfa antibiotics   Review of Systems Review of Systems  Constitutional:  Negative for fever.  Respiratory:  Negative for cough.   Cardiovascular:  Negative for chest pain.  Gastrointestinal:  Negative for abdominal pain, constipation, diarrhea, nausea and vomiting.  Musculoskeletal:  Positive for joint swelling (Both hands with swelling on the dorsum of the hands and some bruising.). Negative for arthralgias and back pain.  Skin:  Positive for wound (1 cm wound of left fourth finger on the dorsum of the finger.). Negative for color change and rash.  Neurological:  Negative for syncope.  All other systems reviewed and are negative.    Physical Exam Triage Vital Signs ED Triage Vitals [10/20/23 1939]  Encounter Vitals Group     BP (!) 191/105     Systolic BP Percentile      Diastolic BP Percentile      Pulse Rate 89     Resp 20     Temp 98.6 F (37 C)     Temp Source Oral     SpO2 98 %     Weight      Height      Head Circumference      Peak Flow  Pain Score 5     Pain Loc      Pain Education      Exclude from Growth Chart    No data found.  Updated Vital Signs BP (!) 191/105 (BP Location: Right Arm)   Pulse 89   Temp 98.6 F (37 C) (Oral)   Resp 20   SpO2 98%   Visual Acuity Right Eye Distance:   Left Eye Distance:   Bilateral Distance:    Right Eye Near:   Left Eye Near:    Bilateral Near:     Physical Exam Vitals and nursing note reviewed.  Constitutional:      General: She is not in acute distress.    Appearance: She is well-developed. She is not ill-appearing or toxic-appearing.  HENT:     Head: Normocephalic and atraumatic.     Right Ear: External ear normal.     Left Ear: External ear normal.     Nose: Nose normal.     Mouth/Throat:     Lips: Pink.     Mouth: Mucous membranes are moist.  Eyes:     Conjunctiva/sclera: Conjunctivae normal.     Pupils: Pupils are equal, round,  and reactive to light.  Cardiovascular:     Rate and Rhythm: Normal rate and regular rhythm.     Heart sounds: S1 normal and S2 normal. No murmur heard. Pulmonary:     Effort: Pulmonary effort is normal. No respiratory distress.     Breath sounds: Normal breath sounds. No decreased breath sounds, wheezing, rhonchi or rales.  Musculoskeletal:        General: No swelling.     Right shoulder: Normal.     Left shoulder: Normal.     Right upper arm: Normal.     Left upper arm: Normal.     Right elbow: Normal.     Left elbow: Normal.     Right forearm: Normal.     Left forearm: Normal.     Right wrist: Normal.     Left wrist: Normal.     Right hand: Swelling and tenderness present. Normal range of motion.     Left hand: Swelling and tenderness present. Normal range of motion.     Comments: Full range of motion of both hands but she has swelling with ecchymosis on the dorsum of both hands.  Skin:    General: Skin is warm and dry.     Capillary Refill: Capillary refill takes less than 2 seconds.     Findings: Laceration (See comments for full description.) present. No rash.     Comments: Left fourth finger with a puncture wound from a dog bite that is approximately 1 cm in send slightly curved pattern curves upwards towards the tip of the finger bilaterally.  There is fatty tissue coming out of the wound.  This was a new injury tonight.  Neurological:     Mental Status: She is alert and oriented to person, place, and time.  Psychiatric:        Mood and Affect: Mood normal.      UC Treatments / Results  Labs (all labs ordered are listed, but only abnormal results are displayed) Labs Reviewed - No data to display  EKG   Radiology DG Hand Complete Left Result Date: 10/20/2023 CLINICAL DATA:  Pain and swelling EXAM: LEFT HAND - COMPLETE 3+ VIEW COMPARISON:  None Available. FINDINGS: Possible single-view tuft fracture fourth distal phalanx. No radiopaque foreign body in the soft  tissues. No malalignment IMPRESSION: Possible single-view tuft fracture fourth distal phalanx. Electronically Signed   By: Esmeralda Hedge M.D.   On: 10/20/2023 20:43   DG Hand Complete Right Result Date: 10/20/2023 CLINICAL DATA:  Pain and swelling EXAM: RIGHT HAND - COMPLETE 3+ VIEW COMPARISON:  None Available. FINDINGS: No fracture or malalignment. No radiopaque foreign body in the soft tissues. Mild degenerative change at the first [facility] joint IMPRESSION: No acute osseous abnormality. Electronically Signed   By: Esmeralda Hedge M.D.   On: 10/20/2023 20:42    Procedures Laceration Repair  Date/Time: 10/20/2023 8:50 PM  Performed by: Guss Legacy, FNP Authorized by: Guss Legacy, FNP   Consent:    Consent obtained:  Verbal   Consent given by:  Patient   Risks, benefits, and alternatives were discussed: yes     Risks discussed:  Infection, need for additional repair, nerve damage, poor wound healing, poor cosmetic result, pain, retained foreign body, tendon damage and vascular damage   Alternatives discussed:  No treatment Universal protocol:    Procedure explained and questions answered to patient or proxy's satisfaction: yes     Immediately prior to procedure, a time out was called: yes     Patient identity confirmed:  Verbally with patient Anesthesia:    Anesthesia method:  Local infiltration   Local anesthetic:  Lidocaine 1% w/o epi Laceration details:    Location:  Finger   Finger location:  L ring finger   Length (cm):  1 (Crescent moon shape with the edges upwards in the semicircle downwards like a smile.)   Depth (mm):  2 Pre-procedure details:    Preparation:  Patient was prepped and draped in usual sterile fashion Exploration:    Limited defect created (wound extended): no     Hemostasis achieved with:  Direct pressure   Wound exploration: wound explored through full range of motion     Contaminated: yes   Treatment:    Area cleansed with:  Chlorhexidine   Amount of  cleaning:  Extensive   Irrigation solution:  Sterile saline   Irrigation volume:  20 ml   Irrigation method:  Syringe   Visualized foreign bodies/material removed: no     Debridement:  Minimal (Had to cut away some excess fatty tissue that was pushing out of the wound.)   Undermining:  None   Scar revision: no   Skin repair:    Repair method:  Sutures   Suture size:  4-0   Suture material:  Prolene   Suture technique:  Simple interrupted   Number of sutures:  3 Approximation:    Approximation:  Close Repair type:    Repair type:  Simple Post-procedure details:    Dressing:  Antibiotic ointment and non-adherent dressing   Procedure completion:  Tolerated well, no immediate complications  (including critical care time)  Medications Ordered in UC Medications  Tdap (BOOSTRIX) injection 0.5 mL (0.5 mLs Intramuscular Given 10/20/23 2026)    Initial Impression / Assessment and Plan / UC Course  I have reviewed the triage vital signs and the nursing notes.  Pertinent labs & imaging results that were available during my care of the patient were reviewed by me and considered in my medical decision making (see chart for details).  Plan of Care: Laceration to left fourth finger: Finger was cleaned well.  Had to cut away some excess fatty tissue.  Laceration was closed with 3 sutures.  Clean the site with warm soapy water, rinse, pat dry, apply  antibiotic ointment and a dry dressing.  Return in 7 to 10 days for suture removal.  Tetanus shot given today.  Given that it is a dog bite, Augmentin 875-125, 1 pill twice daily for 7 days.  Follow-up if symptoms do not improve, worsen or new symptoms occur.  I reviewed the plan of care with the patient and/or the patient's guardian.  The patient and/or guardian had time to ask questions and acknowledged that the questions were answered.  I provided instruction on symptoms or reasons to return here or to go to an ER, if symptoms/condition did not  improve, worsened or if new symptoms occurred.  Final Clinical Impressions(s) / UC Diagnoses   Final diagnoses:  Bilateral hand pain  Bilateral hand swelling  Laceration of left ring finger without foreign body without damage to nail, initial encounter     Discharge Instructions      X-rays were negative.  Clean finger with warm soapy water, pat dry, apply mupirocin ointment and a dry dressing.  Clean the finger once daily.  Augmentin, 875/125 mg, 1 pill twice daily for 7 days.  Get plenty of fluids and rest.  Elevate hands is much as possible and that will reduce swelling.  Use over-the-counter acetaminophen or ibuprofen for pain as needed tetanus booster given today.  Follow-up if symptoms do not improve, worsen or new symptoms occur.  Return in 7 to 10 days for suture removal.   ED Prescriptions     Medication Sig Dispense Auth. Provider   amoxicillin-clavulanate (AUGMENTIN) 875-125 MG tablet Take 1 tablet by mouth 2 (two) times daily after a meal for 7 days. 14 tablet Moxie Kalil, FNP   mupirocin ointment (BACTROBAN) 2 % Apply 1 Application topically 2 (two) times daily. 22 g Guss Legacy, FNP      PDMP not reviewed this encounter.   Guss Legacy, FNP 10/20/23 2100

## 2023-10-20 NOTE — ED Triage Notes (Signed)
 Patient attempted to break up fight between her own pit bull and rotweiller mix dogs. Bite to pad of 4th digit. Bruising to knuckles on right hand. Good ROM. Bruising to back of right forearm.

## 2023-10-20 NOTE — Discharge Instructions (Addendum)
 X-rays were negative.  Clean finger with warm soapy water, pat dry, apply mupirocin ointment and a dry dressing.  Clean the finger once daily.  Augmentin, 875/125 mg, 1 pill twice daily for 7 days.  Get plenty of fluids and rest.  Elevate hands is much as possible and that will reduce swelling.  Use over-the-counter acetaminophen or ibuprofen for pain as needed tetanus booster given today.  Follow-up if symptoms do not improve, worsen or new symptoms occur.  Return in 7 to 10 days for suture removal.

## 2023-10-29 ENCOUNTER — Encounter (HOSPITAL_BASED_OUTPATIENT_CLINIC_OR_DEPARTMENT_OTHER): Payer: Self-pay

## 2023-10-29 ENCOUNTER — Ambulatory Visit (HOSPITAL_BASED_OUTPATIENT_CLINIC_OR_DEPARTMENT_OTHER)
Admission: RE | Admit: 2023-10-29 | Discharge: 2023-10-29 | Disposition: A | Discharge status: Home / Self Care | Source: Ambulatory Visit | Attending: Family Medicine | Admitting: Family Medicine

## 2023-10-29 VITALS — BP 147/90 | HR 88 | Temp 99.4°F | Resp 18

## 2023-10-29 DIAGNOSIS — S65515D Laceration of blood vessel of left ring finger, subsequent encounter: Secondary | ICD-10-CM

## 2023-10-29 NOTE — Discharge Instructions (Addendum)
 All sutures removed without any difficulty.  Wound is well-approximated without erythema, edema nor exudate.  Patient tolerated the procedure well.  Continue to clean the wound with warm soapy water, rinse, pat dry and apply mupirocin ointment as needed.  Follow-up as needed.

## 2023-10-29 NOTE — ED Provider Notes (Signed)
 [name] [facility]    CSN: [id number] Arrival date & time: 10/29/23  0812      History   Chief Complaint Chief Complaint  Patient presents with   Suture / Staple Removal    Entered by patient    HPI Ara Grandmaison is a 63 y.o. female.   Here for nurse visit for suture removal only   Suture / Staple Removal    Past Medical History:  Diagnosis Date   Hypertension     There are no active problems to display for this patient.   History reviewed. No pertinent surgical history.  OB History   No obstetric history on file.      Home Medications    Prior to Admission medications   Medication Sig Start Date End Date Taking? Authorizing Provider  hydrochlorothiazide (MICROZIDE) 12.5 MG capsule Take 12.5 mg by mouth daily. 09/27/23   [provider]  losartan (COZAAR) 50 MG tablet Take 50 mg by mouth daily. 08/16/23   [provider]  Multiple Vitamin (MULTIVITAMIN) tablet Take 1 tablet by mouth daily.    [provider]  mupirocin  ointment (BACTROBAN ) 2 % Apply 1 Application topically 2 (two) times daily. 10/20/23   Guss Legacy, FNP  rosuvastatin (CRESTOR) 10 MG tablet Take 10 mg by mouth daily.    [provider]    Family History Family History  Problem Relation Age of Onset   Hypertension Mother    Neuropathy Mother    Cancer Father    Cancer Paternal Aunt    Diabetes Maternal Grandmother    COPD Maternal Grandfather    Alcohol abuse Maternal Aunt     Social History Social History   Tobacco Use   Smoking status: Never   Smokeless tobacco: Never     Allergies   Lovastatin, Venlafaxine, Lisinopril , and Sulfa antibiotics   Review of Systems Review of Systems   Physical Exam Triage Vital Signs ED Triage Vitals  Encounter Vitals Group     BP 10/29/23 0824 (!) 147/90     Systolic BP Percentile --      Diastolic BP Percentile --      Pulse Rate 10/29/23 0824 88     Resp 10/29/23 0824 18     Temp  10/29/23 0824 99.4 F (37.4 C)     Temp Source 10/29/23 0824 Oral     SpO2 10/29/23 0824 95 %     Weight --      Height --      Head Circumference --      Peak Flow --      Pain Score 10/29/23 0823 0     Pain Loc --      Pain Education --      Exclude from Growth Chart --    No data found.  Updated Vital Signs BP (!) 147/90 (BP Location: Right Arm)   Pulse 88   Temp 99.4 F (37.4 C) (Oral)   Resp 18   SpO2 95%   Visual Acuity Right Eye Distance:   Left Eye Distance:   Bilateral Distance:    Right Eye Near:   Left Eye Near:    Bilateral Near:     Physical Exam   UC Treatments / Results  Labs (all labs ordered are listed, but only abnormal results are displayed) Labs Reviewed - No data to display  EKG   Radiology No results found.  Procedures Procedures (including critical care time)  Medications Ordered in UC Medications -  No data to display  Initial Impression / Assessment and Plan / UC Course  I have reviewed the triage vital signs and the nursing notes.  Pertinent labs & imaging results that were available during my care of the patient were reviewed by me and considered in my medical decision making (see chart for details).  Plan of Care: Laceration of left ring finger: Here for suture removal.  Wound looks good.  Sutures removed without difficulty.  Wound care discussed.  Follow-up as needed.  I reviewed the plan of care with the patient and/or the patient's guardian.  The patient and/or guardian had time to ask questions and acknowledged that the questions were answered.  I provided instruction on symptoms or reasons to return here or to go to an ER, if symptoms/condition did not improve, worsened or if new symptoms occurred.  Final Clinical Impressions(s) / UC Diagnoses   Final diagnoses:  Laceration of blood vessel of left ring finger, subsequent encounter     Discharge Instructions      All sutures removed without any difficulty.  Wound is  well-approximated without erythema, edema nor exudate.  Patient tolerated the procedure well.  Continue to clean the wound with warm soapy water, rinse, pat dry and apply mupirocin ointment as needed.  Follow-up as needed.   ED Prescriptions   None    PDMP not reviewed this encounter.   Guss Legacy, FNP 10/29/23 [phone]

## 2023-10-29 NOTE — ED Triage Notes (Signed)
 Pt here to get out 3 sutures in left 4th finger. Denies any s/s of infection. Completed round of antibiotics that was prescribed.

## 2023-11-03 ENCOUNTER — Ambulatory Visit (HOSPITAL_BASED_OUTPATIENT_CLINIC_OR_DEPARTMENT_OTHER)
Admission: RE | Admit: 2023-11-03 | Discharge: 2023-11-03 | Disposition: A | Discharge status: Home / Self Care | Source: Ambulatory Visit | Attending: Family Medicine | Admitting: Family Medicine

## 2023-11-03 ENCOUNTER — Encounter (HOSPITAL_BASED_OUTPATIENT_CLINIC_OR_DEPARTMENT_OTHER): Payer: Self-pay

## 2023-11-03 VITALS — BP 165/89 | HR 90 | Temp 99.1°F | Resp 18

## 2023-11-03 DIAGNOSIS — S61215D Laceration without foreign body of left ring finger without damage to nail, subsequent encounter: Secondary | ICD-10-CM

## 2023-11-03 MED ORDER — MUPIROCIN 2 % EX OINT: 1.0000 | TOPICAL_OINTMENT | Freq: Two times a day (BID) | CUTANEOUS | 1 refills | Status: AC

## 2023-11-03 NOTE — ED Provider Notes (Signed)
 Jamie Sampson [facility]ARE    CSN: [id number] Arrival date & time: 11/03/23  0845      History   Chief Complaint Chief Complaint  Patient presents with   Finger Injury    Recheck cut on finger - Entered by patient    HPI Jamie Sampson is a 63 y.o. female.   On 10/20/23, the patient was trying to break up a fight between her dog and 2 other dogs.  Her dog is a pit bull the other dogs were Rottweiler mix.  She has bruising and swelling of both hands and a bite to the palmar surface of the fourth finger of the left hand. She was seen on 10/20/23, had the laceration sutured and was treated with Mupirocin  ointment topically and Augmentin  875/125 mg twice daily x 7 days.  Her sutures were removed on 10/29/23.  There is some serous or watery drainage.  She just wants to make sure area is healing right and would also like a refill on mupirocin  ointment.        Past Medical History:  Diagnosis Date   Hypertension     There are no active problems to display for this patient.   History reviewed. No pertinent surgical history.  OB History   No obstetric history on file.      Home Medications    Prior to Admission medications   Medication Sig Start Date End Date Taking? Authorizing Provider  hydrochlorothiazide (MICROZIDE) 12.5 MG capsule Take 12.5 mg by mouth daily. 09/27/23   [provider]  losartan (COZAAR) 50 MG tablet Take 50 mg by mouth daily. 08/16/23   [provider]  Multiple Vitamin (MULTIVITAMIN) tablet Take 1 tablet by mouth daily.    [provider]  mupirocin  ointment (BACTROBAN ) 2 % Apply 1 Application topically 2 (two) times daily. 11/03/23   Guss Legacy, FNP  rosuvastatin (CRESTOR) 10 MG tablet Take 10 mg by mouth daily.    [provider]    Family History Family History  Problem Relation Age of Onset   Hypertension Mother    Neuropathy Mother    Cancer Father    Cancer Paternal Aunt    Diabetes Maternal Grandmother     COPD Maternal Grandfather    Alcohol abuse Maternal Aunt     Social History Social History   Tobacco Use   Smoking status: Never   Smokeless tobacco: Never     Allergies   Lovastatin, Venlafaxine, Lisinopril , and Sulfa antibiotics   Review of Systems Review of Systems  Skin:  Positive for wound (some drainage from her finger after suture removal on 10/29/23.).     Physical Exam Triage Vital Signs ED Triage Vitals  Encounter Vitals Group     BP 11/03/23 0854 (!) 165/89     Systolic BP Percentile --      Diastolic BP Percentile --      Pulse Rate 11/03/23 0854 90     Resp 11/03/23 0854 18     Temp 11/03/23 0854 99.1 F (37.3 C)     Temp Source 11/03/23 0854 Oral     SpO2 11/03/23 0854 98 %     Weight --      Height --      Head Circumference --      Peak Flow --      Pain Score 11/03/23 0853 0     Pain Loc --      Pain Education --      Exclude from  Growth Chart --    No data found.  Updated Vital Signs BP (!) 165/89 (BP Location: Right Arm)   Pulse 90   Temp 99.1 F (37.3 C) (Oral)   Resp 18   SpO2 98%   Visual Acuity Right Eye Distance:   Left Eye Distance:   Bilateral Distance:    Right Eye Near:   Left Eye Near:    Bilateral Near:     Physical Exam Vitals and nursing note reviewed.  Constitutional:      General: She is not in acute distress.    Appearance: She is well-developed. She is not ill-appearing or toxic-appearing.  HENT:     Head: Normocephalic and atraumatic.     Right Ear: External ear normal.     Left Ear: External ear normal.     Nose: Nose normal.     Mouth/Throat:     Lips: Pink.     Mouth: Mucous membranes are moist.  Eyes:     Conjunctiva/sclera: Conjunctivae normal.     Pupils: Pupils are equal, round, and reactive to light.  Cardiovascular:     Rate and Rhythm: Normal rate and regular rhythm.     Heart sounds: S1 normal and S2 normal. No murmur heard. Pulmonary:     Effort: Pulmonary effort is normal. No  respiratory distress.     Breath sounds: Normal breath sounds. No decreased breath sounds, wheezing, rhonchi or rales.  Musculoskeletal:        General: No swelling.  Skin:    General: Skin is warm and dry.     Capillary Refill: Capillary refill takes less than 2 seconds.     Findings: No rash.  Neurological:     Mental Status: She is alert and oriented to person, place, and time.  Psychiatric:        Mood and Affect: Mood normal.      UC Treatments / Results  Labs (all labs ordered are listed, but only abnormal results are displayed) Labs Reviewed - No data to display  EKG   Radiology No results found.  Procedures Procedures (including critical care time)  Medications Ordered in UC Medications - No data to display  Initial Impression / Assessment and Plan / UC Course  I have reviewed the triage vital signs and the nursing notes.  Pertinent labs & imaging results that were available during my care of the patient were reviewed by me and considered in my medical decision making (see chart for details).  Plan of Care: Laceration of left fourth ring finger: Patient had some serous drainage this morning but there is no sign of purulent discharge nor erythema nor induration.  The laceration is approximated with a small less than 1 mm gap and early granulation tissue.  Advised about signs and symptoms of infection and reasons to return for follow-up.  I would start a new antibiotic if there was any concern for infection but right now I think she just needs to allow it to start to dry and granulate.  I refilled her mupirocin  for use as needed.  Advised that when she is at home she can leave it open to air.  [place] she should have it covered with a bandage and wear gloves.  I reviewed the plan of care with the patient and/or the patient's guardian.  The patient and/or guardian had time to ask questions and acknowledged that the questions were answered.  I provided  instruction on symptoms  or reasons to return here or to go to an ER, if symptoms/condition did not improve, worsened or if new symptoms occurred.  Final Clinical Impressions(s) / UC Diagnoses   Final diagnoses:  Laceration of left ring finger without foreign body without damage to nail, subsequent encounter     Discharge Instructions      Clean the finger with warm soapy fingers, rinse, pat dry, apply mupirocin  ointment and at this point can leave open to air.  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  Return if any redness, pus or thick drainage or if not closing and healing appropriately.   ED Prescriptions     Medication Sig Dispense Auth. Provider   mupirocin  ointment (BACTROBAN ) 2 % Apply 1 Application topically 2 (two) times daily. 22 g Guss Legacy, FNP      PDMP not reviewed this encounter.   Guss Legacy, FNP 11/03/23 [phone]

## 2023-11-03 NOTE — Discharge Instructions (Addendum)
 Clean the finger with warm soapy fingers, rinse, pat dry, apply mupirocin  ointment and at this point can leave open to air.  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  Return if any redness, pus or thick drainage or if not closing and healing appropriately.

## 2023-11-03 NOTE — ED Triage Notes (Addendum)
 Pt reports she had stitches out Friday and scab came off yesterday and some drainage noted. She just wants to make sure area is healing right and would also like a refill on mupirocin  ointment.

## 2024-04-29 ENCOUNTER — Encounter (HOSPITAL_BASED_OUTPATIENT_CLINIC_OR_DEPARTMENT_OTHER): Payer: Self-pay

## 2024-04-29 ENCOUNTER — Ambulatory Visit (HOSPITAL_BASED_OUTPATIENT_CLINIC_OR_DEPARTMENT_OTHER)
Admission: RE | Admit: 2024-04-29 | Discharge: 2024-04-29 | Disposition: A | Discharge status: Home / Self Care | Source: Ambulatory Visit | Attending: Family Medicine | Admitting: Family Medicine

## 2024-04-29 VITALS — BP 158/94 | HR 72 | Temp 98.4°F | Resp 18

## 2024-04-29 DIAGNOSIS — R220 Localized swelling, mass and lump, head: Secondary | ICD-10-CM | POA: Diagnosis not present

## 2024-04-29 DIAGNOSIS — K029 Dental caries, unspecified: Secondary | ICD-10-CM

## 2024-04-29 DIAGNOSIS — H0014 Chalazion left upper eyelid: Secondary | ICD-10-CM

## 2024-04-29 DIAGNOSIS — L03211 Cellulitis of face: Secondary | ICD-10-CM | POA: Diagnosis not present

## 2024-04-29 MED ORDER — MOXIFLOXACIN HCL 0.5 % OP SOLN: 1.0000 [drp] | Freq: Three times a day (TID) | OPHTHALMIC | 0 refills | Status: AC

## 2024-04-29 MED ORDER — AMOXICILLIN-POT CLAVULANATE 875-125 MG PO TABS: 1.0000 | ORAL_TABLET | Freq: Two times a day (BID) | ORAL | 0 refills | Status: AC

## 2024-04-29 NOTE — ED Provider Notes (Signed)
[facility]    CSN: [id number] Arrival date & time: 04/29/24  1005      History   Chief Complaint Chief Complaint  Patient presents with   Nasal Congestion   Otalgia   Dental Pain    HPI Jamie Sampson is a 63 y.o. female.   63 year old female who reports that she started having left facial pain and pressure on approximately 04/21/2024 or earlier.  She did have a lot of nasal congestion but it still felt like sinus pressure.  She saw a dentist in October and was told that her teeth were fine and she had a lot of x-rays.  She has several teeth in her mouth that are very dark almost black including 1 left upper molar and then she has a tooth on her left lower jaw (tooth #19 her tooth #20) that has the gum receded almost to the base of the root of the tooth.  The gums are pink but not erythematous and not tender.  She has had left ear pain since 04/27/2024.  She is also having pain in her left eye with some matting and discharge since 04/27/2024.  She denies fever, nausea, vomiting, constipation, diarrhea.  She has been taking Sudafed OTC for the sinus pressure since 04/25/2024.  She did notice her blood pressure is up today.   Otalgia Associated symptoms: no abdominal pain, no cough, no diarrhea, no fever, no rash, no sore throat and no vomiting   Dental Pain Associated symptoms: facial swelling   Associated symptoms: no fever     Past Medical History:  Diagnosis Date   Hypertension     There are no active problems to display for this patient.   History reviewed. No pertinent surgical history.  OB History   No obstetric history on file.      Home Medications    Prior to Admission medications   Medication Sig Start Date End Date Taking? Authorizing Provider  amoxicillin -clavulanate (AUGMENTIN ) 875-125 MG tablet Take 1 tablet by mouth 2 (two) times daily after a meal for 7 days. 04/29/24 05/06/24 Yes Ival Domino, FNP  hydrochlorothiazide (MICROZIDE) 12.5  MG capsule Take 12.5 mg by mouth daily. 09/27/23  Yes [provider]  losartan (COZAAR) 50 MG tablet Take 50 mg by mouth daily. 08/16/23  Yes [provider]  moxifloxacin (VIGAMOX) 0.5 % ophthalmic solution Place 1 drop into both eyes 3 (three) times daily. 04/29/24  Yes Ival Domino, FNP  Multiple Vitamin (MULTIVITAMIN) tablet Take 1 tablet by mouth daily.   Yes [provider]  rosuvastatin (CRESTOR) 10 MG tablet Take 10 mg by mouth daily.   Yes [provider]  mupirocin  ointment (BACTROBAN ) 2 % Apply 1 Application topically 2 (two) times daily. 11/03/23   Ival Domino, FNP    Family History Family History  Problem Relation Age of Onset   Hypertension Mother    Neuropathy Mother    Cancer Father    Cancer Paternal Aunt    Diabetes Maternal Grandmother    COPD Maternal Grandfather    Alcohol abuse Maternal Aunt     Social History Social History   Tobacco Use   Smoking status: Never   Smokeless tobacco: Never     Allergies   Lovastatin, Venlafaxine, Lisinopril , and Sulfa antibiotics   Review of Systems Review of Systems  Constitutional:  Negative for chills and fever.  HENT:  Positive for ear pain, facial swelling, sinus pressure and sinus pain. Negative for sore throat.  Eyes:  Positive for photophobia, pain, discharge, redness and itching. Negative for visual disturbance.  Respiratory:  Negative for cough and shortness of breath.   Cardiovascular:  Negative for chest pain and palpitations.  Gastrointestinal:  Negative for abdominal pain, constipation, diarrhea, nausea and vomiting.  Genitourinary:  Negative for dysuria and hematuria.  Musculoskeletal:  Negative for arthralgias and back pain.  Skin:  Negative for color change and rash.  Neurological:  Negative for seizures and syncope.  All other systems reviewed and are negative.    Physical Exam Triage Vital Signs ED Triage Vitals  Encounter Vitals Group     BP 04/29/24  1112 (!) 158/111     Girls Systolic BP Percentile --      Girls Diastolic BP Percentile --      Boys Systolic BP Percentile --      Boys Diastolic BP Percentile --      Pulse Rate 04/29/24 1112 72     Resp 04/29/24 1112 18     Temp 04/29/24 1112 98.4 F (36.9 C)     Temp Source 04/29/24 1112 Oral     SpO2 04/29/24 1112 99 %     Weight --      Height --      Head Circumference --      Peak Flow --      Pain Score 04/29/24 1110 6     Pain Loc --      Pain Education --      Exclude from Growth Chart --    No data found.  Updated Vital Signs BP (!) 158/94 (BP Location: Right Arm)   Pulse 72   Temp 98.4 F (36.9 C) (Oral)   Resp 18   SpO2 99%   Visual Acuity Right Eye Distance:   Left Eye Distance:   Bilateral Distance:    Right Eye Near:   Left Eye Near:    Bilateral Near:     Physical Exam Vitals and nursing note reviewed.  Constitutional:      General: She is not in acute distress.    Appearance: She is well-developed. She is not ill-appearing, toxic-appearing or diaphoretic.  HENT:     Head: Normocephalic and atraumatic.     Jaw: Tenderness (Left jaw and left cheek) and swelling (Left lower jaw and left cheek with swelling and some erythema) present. No trismus, pain on movement or malocclusion.     Salivary Glands: Right salivary gland is not diffusely enlarged or tender. Left salivary gland is not diffusely enlarged or tender.     Comments: Left cheek erythematous and swollen.  See photo.    Right Ear: Hearing, tympanic membrane, ear canal and external ear normal.     Left Ear: Hearing, tympanic membrane, ear canal and external ear normal.     Nose: No congestion or rhinorrhea.     Right Sinus: No maxillary sinus tenderness or frontal sinus tenderness.     Left Sinus: Maxillary sinus tenderness (Moderate to severe pain over the left maxillary sinus.) present. No frontal sinus tenderness.     Mouth/Throat:     Lips: Pink.     Mouth: Mucous membranes are moist.      Dentition: Abnormal dentition. Dental caries present. No dental tenderness, gingival swelling or dental abscesses.     Pharynx: Uvula midline. No oropharyngeal exudate or posterior oropharyngeal erythema.     Tonsils: No tonsillar exudate.  Eyes:     General: No allergic shiner, visual field deficit or  scleral icterus.       Right eye: No foreign body, discharge or hordeolum.        Left eye: Hordeolum (Left upper eyelid laterally, less than 2 mm in size.) present.No foreign body or discharge.     Extraocular Movements:     Right eye: Normal extraocular motion and no nystagmus.     Left eye: Normal extraocular motion and no nystagmus.     Conjunctiva/sclera:     Right eye: Right conjunctiva is not injected. No chemosis, exudate or hemorrhage.    Left eye: Left conjunctiva is injected. Exudate (Watery discharge) present. No chemosis or hemorrhage.    Pupils: Pupils are equal, round, and reactive to light.  Cardiovascular:     Rate and Rhythm: Normal rate and regular rhythm.     Heart sounds: S1 normal and S2 normal. No murmur heard. Pulmonary:     Effort: Pulmonary effort is normal. No respiratory distress.     Breath sounds: Normal breath sounds. No decreased breath sounds, wheezing, rhonchi or rales.  Abdominal:     General: Bowel sounds are normal.     Palpations: Abdomen is soft.     Tenderness: There is no abdominal tenderness.  Musculoskeletal:        General: No swelling.     Cervical back: Neck supple.  Lymphadenopathy:     Head:     Right side of head: No submental, submandibular, tonsillar, preauricular or posterior auricular adenopathy.     Left side of head: Submental, submandibular and tonsillar adenopathy present. No preauricular or posterior auricular adenopathy.     Cervical: Cervical adenopathy present.     Right cervical: No superficial cervical adenopathy.    Left cervical: Superficial cervical adenopathy present.  Skin:    General: Skin is warm and dry.      Capillary Refill: Capillary refill takes less than 2 seconds.     Findings: No rash.  Neurological:     Mental Status: She is alert and oriented to person, place, and time.  Psychiatric:        Mood and Affect: Mood normal.      UC Treatments / Results  Labs (all labs ordered are listed, but only abnormal results are displayed) Labs Reviewed - No data to display  EKG   Radiology No results found.  Procedures Procedures (including critical care time)  Medications Ordered in UC Medications - No data to display  Initial Impression / Assessment and Plan / UC Course  I have reviewed the triage vital signs and the nursing notes.  Pertinent labs & imaging results that were available during my care of the patient were reviewed by me and considered in my medical decision making (see chart for details).  Plan of Care: Cellulitis of left cheek with swelling and decayed teeth: I do not know if this is left maxillary sinusitis causing swelling of the face or if it is a dental abscess but the patient has a red warm left cheek.  Augmentin  875-125 mg, 1 pill twice daily for 7 days.  Encouraged salt water gargles once or twice daily.  Chalazion of left eye: Moxifloxacin ophthalmic drops, 1 drop 3 times daily to left eye for 5 to 7 days.  May use warm compresses on the left eye.    Follow-up if symptoms do not improve, worsen or new symptoms occur.  Encouraged to see dentist about left upper posterior molar and left lower tooth where the root of the tooth is significantly  exposed by the gum.  See discharge instructions for additional patient education and for signs and symptoms of worsening condition and reasons to go to an emergency department.  I reviewed the plan of care with the patient and/or the patient's guardian.  The patient and/or guardian had time to ask questions and acknowledged that the questions were answered.  I provided instruction on symptoms or reasons to return here or to go  to an ER, if symptoms/condition did not improve, worsened or if new symptoms occurred.  Final Clinical Impressions(s) / UC Diagnoses   Final diagnoses:  Chalazion left upper eyelid  Left facial swelling  Tooth decayed  Cellulitis of cheek     Discharge Instructions      Cellulitis of left cheek with swelling and decayed teeth: I do not know if this is left maxillary sinusitis causing swelling of the face or if it is a dental abscess but the patient has a red warm left cheek.  Augmentin  875-125 mg, 1 pill twice daily for 7 days.  Encouraged salt water gargles once or twice daily.  Chalazion of left eye: Moxifloxacin ophthalmic drops, 1 drop 3 times daily to left eye for 5 to 7 days.  May use warm compresses on the left eye.    Use a disposable soft cotton pad or cotton ball, moistened with warm water, add a drop or two of [employer] (brand only) baby shampoo and lather up the pad.  Rinse the eyes with warm water and then clean one eye with the soapy pad.  Discard that pad.  Repeat the procedure with the other eye.  Perform this eye cleaning once or twice daily to prevent future styes or chalazion.   Follow-up if symptoms do not improve, worsen or new symptoms occur.  Encouraged to see dentist about left upper posterior molar and left lower tooth where the root of the tooth is significantly exposed by the gum.  See below for signs and symptoms of worsening condition and reasons to go to an emergency department.  Contact a health care provider if: You have a fever. Your symptoms do not improve within 1-2 days of starting treatment or you develop new symptoms. Your bone or joint underneath the infected area becomes painful after the skin has healed. Your infection returns in the same area or another area. Signs of this may include: You notice a swollen bump in the infected area. Your red area gets larger, turns dark in color, or becomes more painful. Drainage increases. Pus or a bad  smell develops in your infected area. You have more pain. You feel ill and have muscle aches and weakness. You develop vomiting or diarrhea that will not go away. Get help right away if: You notice red streaks coming from the infected area. You notice the skin turns purple or black and falls off. This symptom may be an emergency. Get help right away. Call 911. Do not wait to see if the symptom will go away. Do not drive yourself to the hospital.     ED Prescriptions     Medication Sig Dispense Auth. Provider   moxifloxacin (VIGAMOX) 0.5 % ophthalmic solution Place 1 drop into both eyes 3 (three) times daily. 3 mL Ival Domino, FNP   amoxicillin -clavulanate (AUGMENTIN ) 875-125 MG tablet Take 1 tablet by mouth 2 (two) times daily after a meal for 7 days. 14 tablet Zerenity Bowron, FNP      PDMP not reviewed this encounter.   Ival Domino, FNP  04/29/24 1204  

## 2024-04-29 NOTE — ED Triage Notes (Signed)
 Pt c/o left sinus issues, left eye has discharge, her teeth and left ear hurt started on Thursday.

## 2024-04-29 NOTE — Discharge Instructions (Addendum)
 Cellulitis of left cheek with swelling and decayed teeth: I do not know if this is left maxillary sinusitis causing swelling of the face or if it is a dental abscess but the patient has a red warm left cheek.  Augmentin  875-125 mg, 1 pill twice daily for 7 days.  Encouraged salt water gargles once or twice daily.  Chalazion of left eye: Moxifloxacin ophthalmic drops, 1 drop 3 times daily to left eye for 5 to 7 days.  May use warm compresses on the left eye.    Use a disposable soft cotton pad or cotton ball, moistened with warm water, add a drop or two of [employer] (brand only) baby shampoo and lather up the pad.  Rinse the eyes with warm water and then clean one eye with the soapy pad.  Discard that pad.  Repeat the procedure with the other eye.  Perform this eye cleaning once or twice daily to prevent future styes or chalazion.   Follow-up if symptoms do not improve, worsen or new symptoms occur.  Encouraged to see dentist about left upper posterior molar and left lower tooth where the root of the tooth is significantly exposed by the gum.  See below for signs and symptoms of worsening condition and reasons to go to an emergency department.  Contact a health care provider if: You have a fever. Your symptoms do not improve within 1-2 days of starting treatment or you develop new symptoms. Your bone or joint underneath the infected area becomes painful after the skin has healed. Your infection returns in the same area or another area. Signs of this may include: You notice a swollen bump in the infected area. Your red area gets larger, turns dark in color, or becomes more painful. Drainage increases. Pus or a bad smell develops in your infected area. You have more pain. You feel ill and have muscle aches and weakness. You develop vomiting or diarrhea that will not go away. Get help right away if: You notice red streaks coming from the infected area. You notice the skin turns purple or  black and falls off. This symptom may be an emergency. Get help right away. Call 911. Do not wait to see if the symptom will go away. Do not drive yourself to the hospital.
